# Patient Record
Sex: Male | Born: 1960 | Race: Black or African American | Hispanic: No | Marital: Single | Smoking: Heavy tobacco smoker
Health system: Southern US, Community
[De-identification: ages and names within clinical notes are randomized; demographics above are authoritative.]

---

## 2014-10-05 ENCOUNTER — Encounter (HOSPITAL_COMMUNITY): Payer: Self-pay | Admitting: Emergency Medicine

## 2014-10-05 ENCOUNTER — Emergency Department (HOSPITAL_COMMUNITY): Payer: Self-pay

## 2014-10-05 ENCOUNTER — Emergency Department (HOSPITAL_COMMUNITY)
Admission: EM | Admit: 2014-10-05 | Discharge: 2014-10-05 | Disposition: A | Discharge status: Home / Self Care | Payer: Self-pay | Attending: Emergency Medicine | Admitting: Emergency Medicine

## 2014-10-05 DIAGNOSIS — W010XXA Fall on same level from slipping, tripping and stumbling without subsequent striking against object, initial encounter: Secondary | ICD-10-CM | POA: Insufficient documentation

## 2014-10-05 DIAGNOSIS — Y998 Other external cause status: Secondary | ICD-10-CM | POA: Insufficient documentation

## 2014-10-05 DIAGNOSIS — Y9339 Activity, other involving climbing, rappelling and jumping off: Secondary | ICD-10-CM | POA: Insufficient documentation

## 2014-10-05 DIAGNOSIS — Y9289 Other specified places as the place of occurrence of the external cause: Secondary | ICD-10-CM | POA: Insufficient documentation

## 2014-10-05 DIAGNOSIS — S2231XA Fracture of one rib, right side, initial encounter for closed fracture: Secondary | ICD-10-CM | POA: Insufficient documentation

## 2014-10-05 DIAGNOSIS — Z720 Tobacco use: Secondary | ICD-10-CM | POA: Insufficient documentation

## 2014-10-05 DIAGNOSIS — W19XXXA Unspecified fall, initial encounter: Secondary | ICD-10-CM

## 2014-10-05 MED ORDER — HYDROCODONE-ACETAMINOPHEN 5-325 MG PO TABS: 1.0000 | ORAL_TABLET | ORAL | Status: AC | PRN

## 2014-10-05 MED ORDER — HYDROCODONE-ACETAMINOPHEN 5-325 MG PO TABS
1.0000 | ORAL_TABLET | ORAL | Status: AC
  Administered 2014-10-05: 1 via ORAL
  Filled 2014-10-05: qty 1

## 2014-10-05 MED ORDER — NAPROXEN 500 MG PO TABS: 500.0000 mg | ORAL_TABLET | Freq: Two times a day (BID) | ORAL | Status: AC

## 2014-10-05 NOTE — Discharge Instructions (Signed)
Rib Fracture °A rib fracture is a break or crack in one of the bones of the ribs. The ribs are like a cage that goes around your upper chest. A broken or cracked rib is often painful, but most do not cause other problems. Most rib fractures heal on their own in 1-3 months. °HOME CARE °· Avoid activities that cause pain to the injured area. Protect your injured area. °· Slowly increase activity as told by your doctor. °· Take medicine as told by your doctor. °· Put ice on the injured area for the first 1-2 days after you have been treated or as told by your doctor. °¨ Put ice in a plastic bag. °¨ Place a towel between your skin and the bag. °¨ Leave the ice on for 15-20 minutes at a time, every 2 hours while you are awake. °· Do deep breathing as told by your doctor. You may be told to: °¨ Take deep breaths many times a day. °¨ Cough many times a day while hugging a pillow. °¨ Use a device (incentive spirometer) to perform deep breathing many times a day. °· Drink enough fluids to keep your pee (urine) clear or pale yellow.   °· Do not wear a rib belt or binder. These do not allow you to breathe deeply. °GET HELP RIGHT AWAY IF:  °· You have a fever. °· You have trouble breathing.   °· You cannot stop coughing. °· You cough up thick or bloody spit (mucus).   °· You feel sick to your stomach (nauseous), throw up (vomit), or have belly (abdominal) pain.   °· Your pain gets worse and medicine does not help.   °MAKE SURE YOU:  °· Understand these instructions. °· Will watch your condition. °· Will get help right away if you are not doing well or get worse. °Document Released: 06/30/2008 Document Revised: 01/16/2013 Document Reviewed: 11/23/2012 °[employer]® Patient Information ©2015 [employer]. This information is not intended to replace advice given to you by your health care provider. Make sure you discuss any questions you have with your health care provider. ° °

## 2014-10-05 NOTE — ED Notes (Signed)
Patient to X-ray

## 2014-10-05 NOTE — ED Notes (Signed)
Patient states that he jumped across a puddle and fell hurt his back on Christmas day; now states pain is worse and hurts when he coughs.

## 2014-10-05 NOTE — ED Provider Notes (Signed)
CSN: [id number]     Arrival date & time 10/05/14  0700 History   First MD Initiated Contact with Patient 10/05/14 [phone]     Chief Complaint  Patient presents with  . Fall    HPI Comments: Pt was trying to jump across a canal.  He slipped and landed on soft ground onto his right posterior ribs.  No other complaints of injuries.  Patient is a 54 y.o. male presenting with fall. The history is provided by the patient.  Fall This is a new problem. Episode onset: over a week ago around christmas. The problem occurs constantly. The problem has not changed since onset.Associated symptoms include chest pain. Pertinent negatives include no abdominal pain, no headaches and no shortness of breath. Exacerbated by: deep breathing and coughing. Nothing relieves the symptoms. He has tried nothing for the symptoms. The treatment provided no relief.    History reviewed. No pertinent past medical history. History reviewed. No pertinent past surgical history. No family history on file. History  Substance Use Topics  . Smoking status: Heavy Tobacco Smoker -- 0.50 packs/day    Types: Cigarettes  . Smokeless tobacco: Not on file  . Alcohol Use: Yes     Comment: six pack a week    Review of Systems  Respiratory: Negative for shortness of breath.   Cardiovascular: Positive for chest pain.  Gastrointestinal: Negative for nausea, vomiting and abdominal pain.  Musculoskeletal: Negative for joint swelling.  Neurological: Negative for headaches.  All other systems reviewed and are negative.     Allergies  Review of patient's allergies indicates no known allergies.  Home Medications   Prior to Admission medications   Medication Sig Start Date End Date Taking? Authorizing Provider  HYDROcodone-acetaminophen (NORCO/VICODIN) 5-325 MG per tablet Take 1-2 tablets by mouth every 4 (four) hours as needed. 10/05/14   Linwood Dibbles, MD  naproxen (NAPROSYN) 500 MG tablet Take 1 tablet (500 mg total) by mouth 2 (two)  times daily with a meal. As needed for pain 10/05/14   Linwood Dibbles, MD   BP 124/83 mmHg  Pulse 59  Temp(Src) 98.3 F (36.8 C) (Oral)  Resp 14  Ht  (1.676 m)  Wt 130 lb (58.968 kg)  BMI 20.99 kg/m2  SpO2 95% Physical Exam  Constitutional: He appears well-developed and well-nourished. No distress.  HENT:  Head: Normocephalic and atraumatic.  Right Ear: External ear normal.  Left Ear: External ear normal.  Eyes: Conjunctivae are normal. Right eye exhibits no discharge. Left eye exhibits no discharge. No scleral icterus.  Neck: Neck supple. No tracheal deviation present.  Cardiovascular: Normal rate, regular rhythm and intact distal pulses.   Pulmonary/Chest: Effort normal and breath sounds normal. No stridor. No respiratory distress. He has no wheezes. He has no rales.   He exhibits tenderness.  Abdominal: Soft. Bowel sounds are normal. He exhibits no distension. There is no tenderness. There is no rebound and no guarding.  Musculoskeletal: He exhibits no edema or tenderness.  Neurological: He is alert. He has normal strength. No cranial nerve deficit (no facial droop, extraocular movements intact, no slurred speech) or sensory deficit. He exhibits normal muscle tone. He displays no seizure activity. Coordination normal.  Skin: Skin is warm and dry. No rash noted.  Psychiatric: He has a normal mood and affect.  Nursing note and vitals reviewed.   ED Course  Procedures (including critical care time) Labs Review Labs Reviewed - No data to display  Imaging Review Dg Ribs Unilateral W/chest  Right  10/05/2014   CLINICAL DATA:  Slipped on water 5 days ago and fell backwards on his back. Back pain.  EXAM: RIGHT RIBS AND CHEST - 3+ VIEW  COMPARISON:  None.  FINDINGS: There is a rib fracture noted through the posterior right seventh rib. No additional fracture seen. No pneumothorax or effusion. Lungs are clear. Heart is normal size.  IMPRESSION: Right posterior seventh rib fracture.  No  associated pneumothorax.   Electronically Signed   By: Charlett Nose M.D.   On: 10/05/2014 08:12     MDM   Final diagnoses:  Fall  Rib fracture, right, closed, initial encounter    Isolated rib fracture noted.  Pt is in no distress.  Will dc home with pain meds, IS.  Discussed treatment and expected time frame for recovery with patient and family    Linwood Dibbles, MD 10/05/14 [phone]

## 2016-01-18 IMAGING — CR DG RIBS W/ CHEST 3+V*R*
3 series · 3 of 3 positions shown · non-contrast
Comparison: None.

CLINICAL DATA: Slipped on water 5 days ago and fell backwards on
his back. Back pain.

EXAM:
RIGHT RIBS AND CHEST - 3+ VIEW

[chest pa]
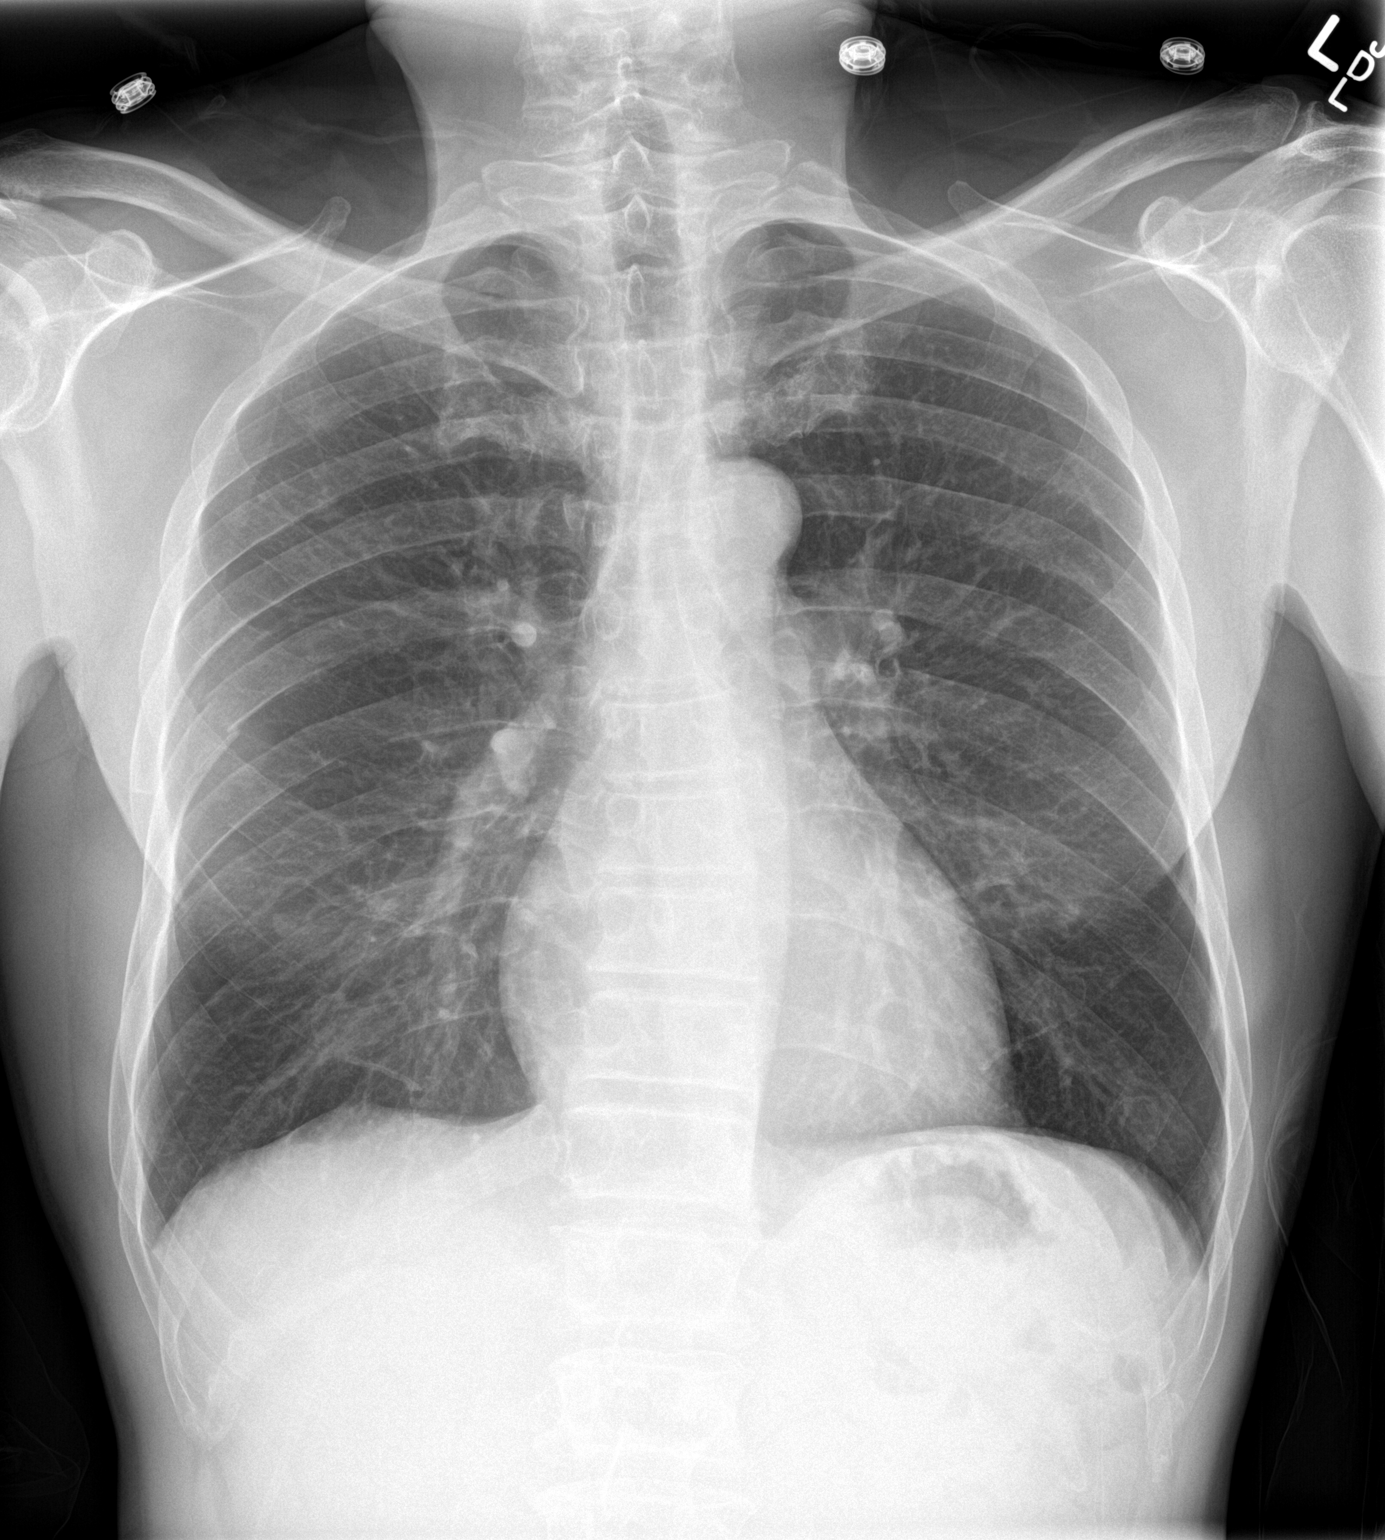

[rib ap]
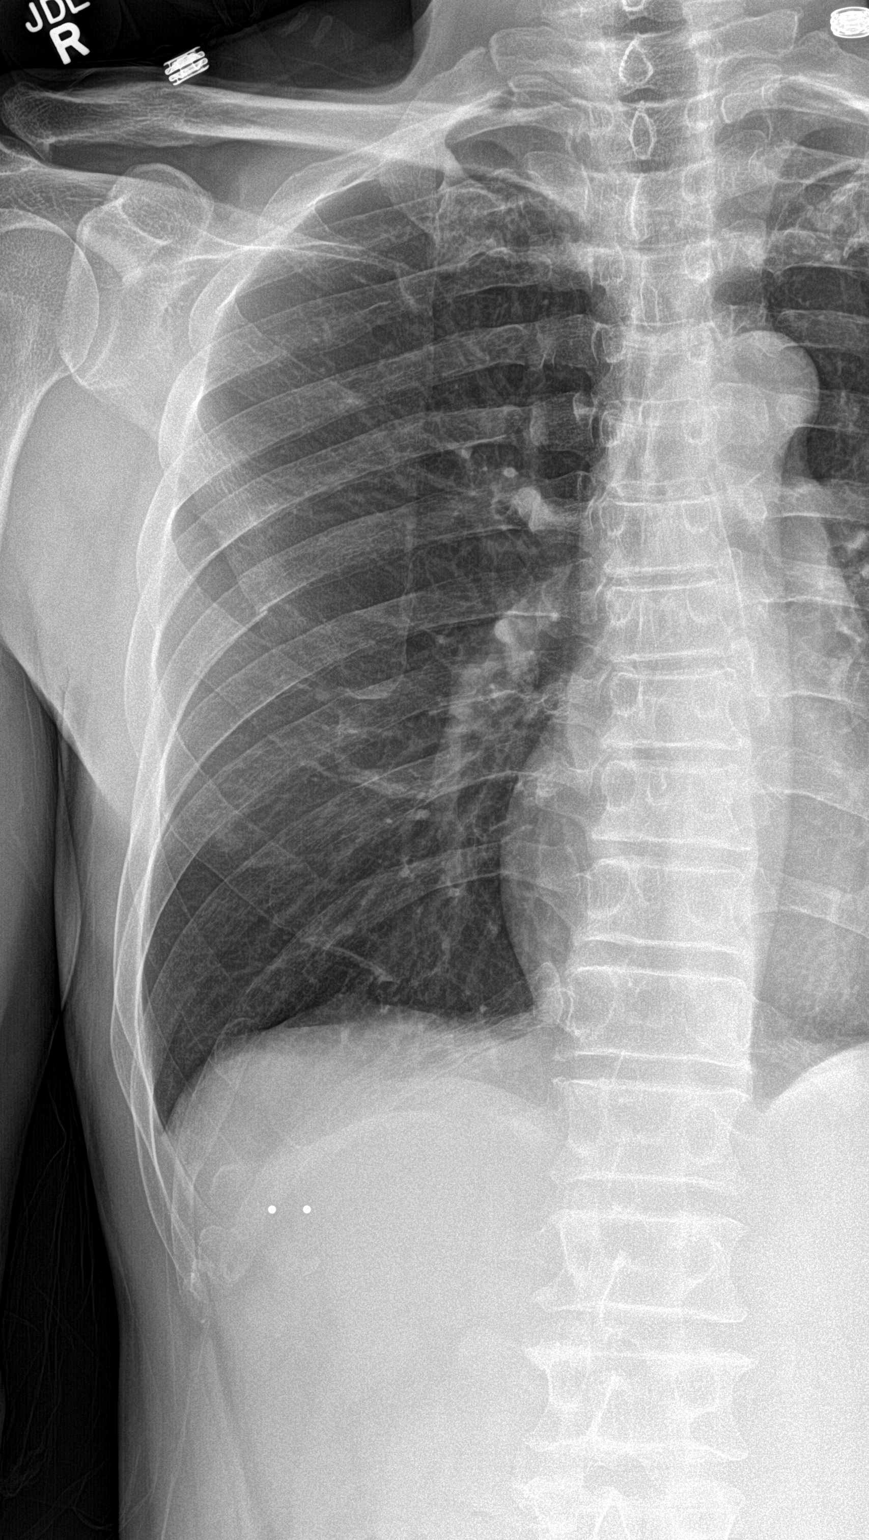

[rib ap obl]
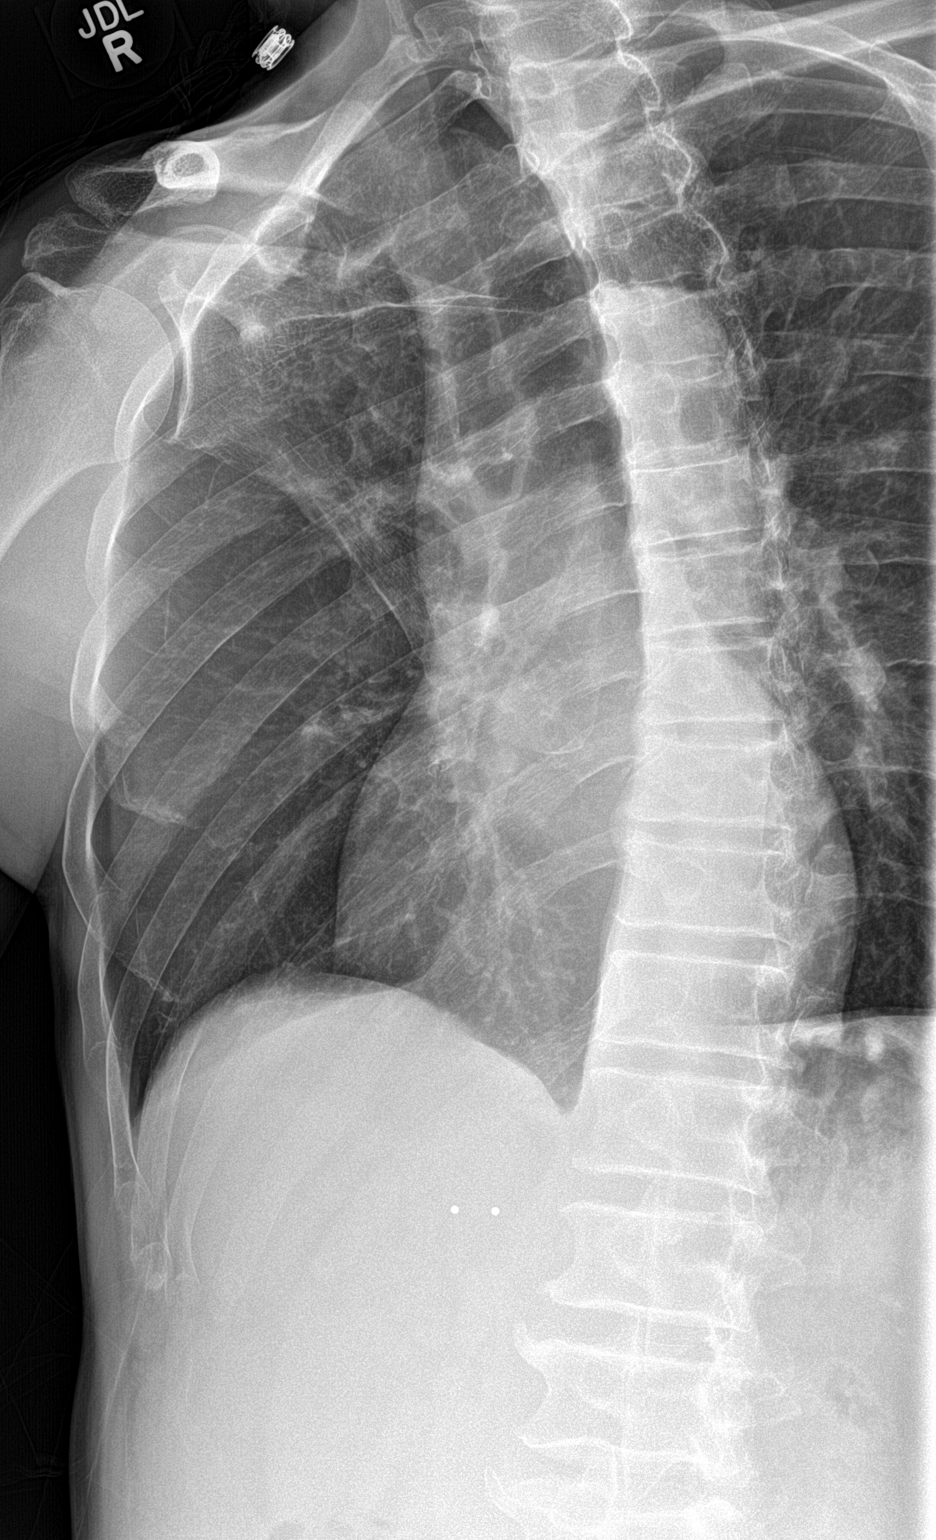

[3 of 3 positions shown; findings below may reference images not displayed]

FINDINGS: There is a rib fracture noted through the posterior right seventh
rib. No additional fracture seen. No pneumothorax or effusion. Lungs
are clear. Heart is normal size.
IMPRESSION: Right posterior seventh rib fracture.  No associated pneumothorax.

## 2020-09-04 DEATH — deceased
